# Patient Record
Sex: Female | Born: 1965 | Race: White | Hispanic: No | Marital: Married
Health system: Southern US, Community
[De-identification: ages and names within clinical notes are randomized; demographics above are authoritative.]

## PROBLEM LIST (undated history)

## (undated) DIAGNOSIS — D219 Benign neoplasm of connective and other soft tissue, unspecified: Secondary | ICD-10-CM

## (undated) DIAGNOSIS — E0590 Thyrotoxicosis, unspecified without thyrotoxic crisis or storm: Secondary | ICD-10-CM

## (undated) DIAGNOSIS — D649 Anemia, unspecified: Secondary | ICD-10-CM

---

## 2017-04-27 ENCOUNTER — Other Ambulatory Visit: Payer: Self-pay | Admitting: Obstetrics and Gynecology

## 2017-04-27 DIAGNOSIS — D250 Submucous leiomyoma of uterus: Secondary | ICD-10-CM

## 2017-05-06 ENCOUNTER — Ambulatory Visit
Admission: RE | Admit: 2017-05-06 | Discharge: 2017-05-06 | Disposition: A | Discharge status: Home / Self Care | Payer: BLUE CROSS/BLUE SHIELD | Source: Ambulatory Visit | Attending: Obstetrics and Gynecology | Admitting: Obstetrics and Gynecology

## 2017-05-06 DIAGNOSIS — D250 Submucous leiomyoma of uterus: Secondary | ICD-10-CM

## 2017-05-06 HISTORY — DX: Benign neoplasm of connective and other soft tissue, unspecified: D21.9

## 2017-05-06 HISTORY — DX: Anemia, unspecified: D64.9

## 2017-05-06 HISTORY — PX: IR RADIOLOGIST EVAL & MGMT: IMG5224

## 2017-05-06 HISTORY — DX: Thyrotoxicosis, unspecified without thyrotoxic crisis or storm: E05.90

## 2017-05-06 NOTE — Consult Note (Signed)
Chief Complaint: Patient was seen in consultation today for  Chief Complaint  Patient presents with  . Advice Only    Consult for [place]    at the request of [place]  Referring Physician(s): Mattern,Shannon E  History of Present Illness: Sabrina Clay is a 52 y.o. female with a history of uterine fibroids and menometrorrhagia.  She had relatively regular but heavy cycles for the past 5 years. However, her most recent regular cycle was in July 2018. Beginning in August, she has been having daily bleeding and intermittent cramping. She has undergone both a Pap smear and endometrial biopsy which were negative for evidence of malignancy. A recently obtained MRI of the pelvis demonstrates multiple enhancing uterine fibroids.  Her bleeding is quite heavy with passage of clots and requiring frequent changing of maximum strength pads every 3-4 hours. This is affecting her everyday quality of life. She has been wanting to take up golfing with her husband and son but is unable to due to her fear of spillover while out on course.  Additionally, she is clinically anemic. Her most hemoglobin was less than 9. She is currently taking iron in the form of ferrous sulfate 325 mg twice daily. She has undergone prior treatments for her menorrhagia including endometrial ablation approximately 6 or 7 years ago. She is currently on over-the-counter prophylaxis which is been ineffective at stopping her bleeding. She endorses some mild bulk related symptoms including bloating.  She has experienced occasional hot flashes consistent with premenopausal symptoms.  She denies fever, chills, weight loss, chest pain or shortness of breath. She has discussed the possibility of hysterectomy with Dr. Jobe Igo but is reluctant at this time due to her very busy work and home life.  She is diagnosed with hyperthyroidism approximately 1 year ago and is currently on methimazole. Her thyroid hormone levels are now  normal.  Past Medical History:  Diagnosis Date  . Anemia   . Fibroids   . Hyperthyroidism     Allergies: Patient has no known allergies.  Medications: Prior to Admission medications   Medication Sig Start Date End Date Taking? Authorizing Provider  ferrous sulfate 325 (65 FE) MG tablet Take 325 mg by mouth 2 (two) times daily with a meal.   Yes [provider]  methimazole (TAPAZOLE) 5 MG tablet Take 5 mg by mouth daily. Take 1.5 mg tablets (7.5 mg total) by mouth daily.   Yes [provider]  norethindrone (AYGESTIN) 5 MG tablet Take 5 mg by mouth daily.   Yes [provider]     No family history on file.  Social History   Socioeconomic History  . Marital status: Married    Spouse name: Not on file  . Number of children: Not on file  . Years of education: Not on file  . Highest education level: Not on file  Social Needs  . Financial resource strain: Not on file  . Food insecurity - worry: Not on file  . Food insecurity - inability: Not on file  . Transportation needs - medical: Not on file  . Transportation needs - non-medical: Not on file  Occupational History  . Not on file  Tobacco Use  . Smoking status: Not on file  Substance and Sexual Activity  . Alcohol use: Not on file  . Drug use: Not on file  . Sexual activity: Not on file  Other Topics Concern  . Not on file  Social History Narrative  . Not on  file    Review of Systems: A 12 point ROS discussed and pertinent positives are indicated in the HPI above.  All other systems are negative.  Review of Systems  Vital Signs: BP (!) 159/81   Pulse 80   Temp 98.6 F (37 C) (Oral)   Resp 14   Ht 5\' 8"  (1.727 m)   Wt 160 lb (72.6 kg)   SpO2 100%   BMI 24.33 kg/m   Physical Exam  Constitutional: She is oriented to person, place, and time. She appears well-developed and well-nourished. No distress.  HENT:  Head: Normocephalic and atraumatic.  Neck: No thyromegaly present.   Cardiovascular: Normal rate and regular rhythm.  Pulses:      Radial pulses are 2+ on the left side.       Femoral pulses are 2+ on the right side, and 2+ on the left side. Pulmonary/Chest: Effort normal and breath sounds normal.  Abdominal: Soft. She exhibits no distension.  Uterus palpable above the pubic symphysis  Neurological: She is alert and oriented to person, place, and time.  Skin: Skin is warm and dry.  Psychiatric: She has a normal mood and affect. Her behavior is normal.  Nursing note and vitals reviewed.   Imaging: Ir Radiologist Eval & Mgmt  Result Date: 05/06/2017 Please refer to notes tab for details about interventional procedure. (Op Note)   Labs:  CBC: No results for input(s): WBC, HGB, HCT, PLT in the last 8760 hours.  COAGS: No results for input(s): INR, APTT in the last 8760 hours.  BMP: No results for input(s): NA, K, CL, CO2, GLUCOSE, BUN, CALCIUM, CREATININE, GFRNONAA, GFRAA in the last 8760 hours.  Invalid input(s): CMP  LIVER FUNCTION TESTS: No results for input(s): BILITOT, AST, ALT, ALKPHOS, PROT, ALBUMIN in the last 8760 hours.  TUMOR MARKERS: No results for input(s): AFPTM, CEA, CA199, CHROMGRNA in the last 8760 hours.  Assessment and Plan:  Perimenopausal 52 year old female with prolonged menometrorrhagia for the past 6 months complicated by symptomatic anemia requiring iron supplementation. Prior Pap smear and endometrial biopsy are negative for malignancy. She has an enhancing uterine fibroids on her recent MRI. We discussed the various treatment options including hormonal deprivation therapy, uterine artery embolization and hysterectomy. We are in agreement that either hysterectomy or uterine artery embolization would be her best option. Currently, she is not interested in hysterectomy due to her very busy work and home life.  She would be an excellent candidate for uterine artery embolization and after discussing the risks, benefits and  alternatives, she has decided to proceed.  1.) Please schedule for hypogastric nerve block and uterine artery embolization to be performed at [facility].  Thank you for this interesting consult.  I greatly enjoyed meeting Sabrina Clay and look forward to participating in their care.  A copy of this report was sent to the requesting provider on this date.  Electronically Signed: Jacqulynn Cadet 05/06/2017, 12:25 PM   I spent a total of  60 Minutes  in face to face in clinical consultation, greater than 50% of which was counseling/coordinating care for symptomatic uterine fibroids.

## 2017-05-21 ENCOUNTER — Other Ambulatory Visit (HOSPITAL_COMMUNITY): Payer: Self-pay | Admitting: Interventional Radiology

## 2017-05-21 DIAGNOSIS — D250 Submucous leiomyoma of uterus: Secondary | ICD-10-CM

## 2017-06-18 ENCOUNTER — Ambulatory Visit
Admission: RE | Admit: 2017-06-18 | Discharge: 2017-06-18 | Disposition: A | Discharge status: Home / Self Care | Payer: BLUE CROSS/BLUE SHIELD | Source: Ambulatory Visit | Attending: Interventional Radiology | Admitting: Interventional Radiology

## 2017-06-18 ENCOUNTER — Encounter: Payer: Self-pay | Admitting: *Deleted

## 2017-06-18 DIAGNOSIS — D250 Submucous leiomyoma of uterus: Secondary | ICD-10-CM

## 2017-06-18 HISTORY — PX: IR RADIOLOGIST EVAL & MGMT: IMG5224

## 2017-06-18 NOTE — Progress Notes (Signed)
Chief Complaint: Patient was seen in follow up today for  Chief Complaint  Patient presents with  . Follow-up    1 mo follow up [place]   at the request of [place]  Referring Physician(s): Dr. Jobe Igo  History of Present Illness: Sabrina Clay is a 52 y.o. female with a history of symptomatic uterine fibroids and menometrorrhagia resulting in symptomatic anemia.  She underwent bilateral uterine artery embolization with hypogastric nerve block on 05/18/2017 and presents today for her initial follow-up evaluation.  Her postprocedural course was uncomplicated.  She was discharged on postoperative day 1 and did well at home.  She r returned to week at 1 week post procedure.  She has had some mild fatigue, but this is resolving.  Her constant daily bleeding has completely resolved.  She had a normal and relatively light cycle last week.  She has some mild residual pelvic cramping.  She denies fever, chills, chest pain or shortness of breath.  No significant vaginal discharge or foul odor.  Past Medical History:  Diagnosis Date  . Anemia   . Fibroids   . Hyperthyroidism     Allergies: Patient has no known allergies.  Medications: Prior to Admission medications   Medication Sig Start Date End Date Taking? Authorizing Provider  ferrous sulfate 325 (65 FE) MG tablet Take 325 mg by mouth 2 (two) times daily with a meal.   Yes [provider]  methimazole (TAPAZOLE) 5 MG tablet Take 5 mg by mouth daily. Take 1.5 mg tablets (7.5 mg total) by mouth daily.   Yes [provider]  norethindrone (AYGESTIN) 5 MG tablet Take 5 mg by mouth daily.    [provider]     No family history on file.  Social History   Socioeconomic History  . Marital status: Married    Spouse name: Not on file  . Number of children: Not on file  . Years of education: Not on file  . Highest education level: Not on file  Occupational History  . Not on file  Social Needs  .  Financial resource strain: Not on file  . Food insecurity:    Worry: Not on file    Inability: Not on file  . Transportation needs:    Medical: Not on file    Non-medical: Not on file  Tobacco Use  . Smoking status: Not on file  Substance and Sexual Activity  . Alcohol use: Not on file  . Drug use: Not on file  . Sexual activity: Not on file  Lifestyle  . Physical activity:    Days per week: Not on file    Minutes per session: Not on file  . Stress: Not on file  Relationships  . Social connections:    Talks on phone: Not on file    Gets together: Not on file    Attends religious service: Not on file    Active member of club or organization: Not on file    Attends meetings of clubs or organizations: Not on file    Relationship status: Not on file  Other Topics Concern  . Not on file  Social History Narrative  . Not on file   Review of Systems: A 12 point ROS discussed and pertinent positives are indicated in the HPI above.  All other systems are negative.  Review of Systems  Vital Signs: BP (!) 151/79   Pulse 72   Temp 97.9 F (36.6 C) (Oral)   Resp 14  LMP 06/12/2017   SpO2 100%   Physical Exam  Constitutional: She is oriented to person, place, and time. She appears well-developed and well-nourished. No distress.  HENT:  Head: Normocephalic and atraumatic.  Eyes: No scleral icterus.  Cardiovascular: Normal rate and regular rhythm.  Pulmonary/Chest: Effort normal.  Abdominal: Soft. She exhibits no distension. There is no tenderness.  Neurological: She is alert and oriented to person, place, and time.  Skin: Skin is warm and dry.  Well healed access site, left volar wrist.   Psychiatric: She has a normal mood and affect. Her behavior is normal.  Nursing note and vitals reviewed.    Imaging: Ir Radiologist Eval & Mgmt  Result Date: 06/18/2017 Please refer to notes tab for details about interventional procedure. (Op Note)   Labs:  CBC: No results for  input(s): WBC, HGB, HCT, PLT in the last 8760 hours.  COAGS: No results for input(s): INR, APTT in the last 8760 hours.  BMP: No results for input(s): NA, K, CL, CO2, GLUCOSE, BUN, CALCIUM, CREATININE, GFRNONAA, GFRAA in the last 8760 hours.  Invalid input(s): CMP  LIVER FUNCTION TESTS: No results for input(s): BILITOT, AST, ALT, ALKPHOS, PROT, ALBUMIN in the last 8760 hours.  TUMOR MARKERS: No results for input(s): AFPTM, CEA, CA199, CHROMGRNA in the last 8760 hours.  Assessment and Plan:  52 year old female doing exceptionally well status post bilateral uterine artery embolization from a radial artery approach.  Her postoperative course was uncomplicated.  She did well and returned to work 1 week post procedure.  She has now currently asymptomatic and without complaint.  She had a normal and relatively light menstrual cycle last week.  Her continuous daily bleeding which she was struggling with has completely resolved.  Additionally, she reports that her most recent hemoglobin has improved to 10 and she is feeling more energetic and less fatigued.  1.)  Clinic staff to call in 3 months for telephone evaluation. 2.)  Return clinic visit in 6 months for final postprocedural evaluation.  Electronically Signed: Jacqulynn Cadet 06/18/2017, 11:10 AM   I spent a total of 15 Minutes in face to face in clinical consultation, greater than 50% of which was counseling/coordinating care for symptomatic uterine fibroids post [place].

## 2017-10-22 ENCOUNTER — Telehealth: Payer: Self-pay | Admitting: Radiology

## 2017-10-22 ENCOUNTER — Other Ambulatory Visit (HOSPITAL_COMMUNITY): Payer: Self-pay | Admitting: Interventional Radiology

## 2017-10-22 DIAGNOSIS — D219 Benign neoplasm of connective and other soft tissue, unspecified: Secondary | ICD-10-CM

## 2017-10-22 NOTE — Telephone Encounter (Signed)
Phoned patient to schedule 6 mo follow up [place].  Patient reports that she is doing great & has only had 1 menstrual cycle since the [place].  Because she is doing so well, she is deferring IR follow up but will continue routine gynecologic follow ups with Dr. Brigid Re.  Fusaye Wachtel Riki Rusk, RN 10/22/2017 10:35 AM

## 2017-12-02 ENCOUNTER — Other Ambulatory Visit: Payer: Self-pay | Admitting: Radiology

## 2017-12-02 ENCOUNTER — Other Ambulatory Visit (HOSPITAL_COMMUNITY): Payer: Self-pay | Admitting: Interventional Radiology

## 2017-12-02 DIAGNOSIS — D219 Benign neoplasm of connective and other soft tissue, unspecified: Secondary | ICD-10-CM

## 2017-12-02 DIAGNOSIS — D259 Leiomyoma of uterus, unspecified: Secondary | ICD-10-CM

## 2020-02-24 ENCOUNTER — Other Ambulatory Visit: Payer: Self-pay | Admitting: Family

## 2020-02-24 DIAGNOSIS — R9389 Abnormal findings on diagnostic imaging of other specified body structures: Secondary | ICD-10-CM

## 2020-02-29 ENCOUNTER — Other Ambulatory Visit: Payer: Self-pay

## 2020-02-29 ENCOUNTER — Ambulatory Visit
Admission: RE | Admit: 2020-02-29 | Discharge: 2020-02-29 | Disposition: A | Discharge status: Home / Self Care | Payer: BLUE CROSS/BLUE SHIELD | Source: Ambulatory Visit | Attending: Family | Admitting: Family

## 2020-02-29 ENCOUNTER — Other Ambulatory Visit: Payer: Self-pay | Admitting: Pediatrics

## 2020-02-29 ENCOUNTER — Ambulatory Visit
Admission: RE | Admit: 2020-02-29 | Discharge: 2020-02-29 | Disposition: A | Discharge status: Home / Self Care | Payer: BC Managed Care – PPO | Source: Ambulatory Visit | Attending: Family | Admitting: Family

## 2020-02-29 DIAGNOSIS — R9389 Abnormal findings on diagnostic imaging of other specified body structures: Secondary | ICD-10-CM

## 2020-02-29 MED ORDER — GADOBUTROL 1 MMOL/ML IV SOLN
5.0000 mL | Freq: Once | INTRAVENOUS | Status: AC | PRN
  Administered 2020-02-29: 5 mL via INTRAVENOUS

## 2022-03-29 IMAGING — MR MR BREAST BX W LOC DEV 1ST LESION IMAGE BX SPEC MR GUIDE*L*
6 of 8 series · 32 of 48 positions shown · IV contrast (6 ml gadavist)
Comparison: Previous exams.
COMPARISON: Previous exams.

Addendum:
CLINICAL DATA: Mass in the inferior left breast

EXAM:
MRI GUIDED CORE NEEDLE BIOPSY OF THE LEFT BREAST
TECHNIQUE: Multiplanar, multisequence MR imaging of the left breast was
performed both before and after administration of intravenous
contrast.
CONTRAST:  7 mL of Gadavist

[Series 2: fiducial unilateral · sagittal · 2.0mm · 1.33mm/px · 1 of 52 slices shown]
[im 1/52]
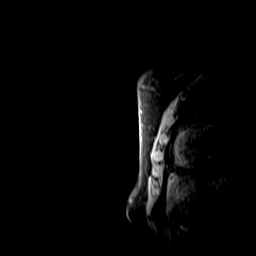

[Series 3: dynamic pre · axial · non-contrast · 1.3mm · 0.73mm/px · z∈[-80,+106]mm · 6 of 144 slices shown]
[im 1/144]
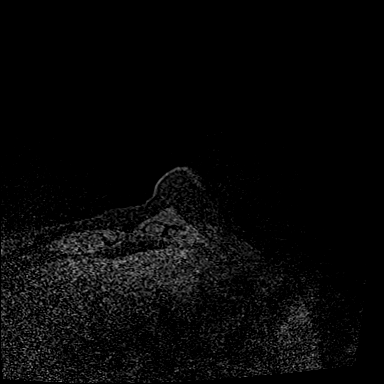
[im 29/144]
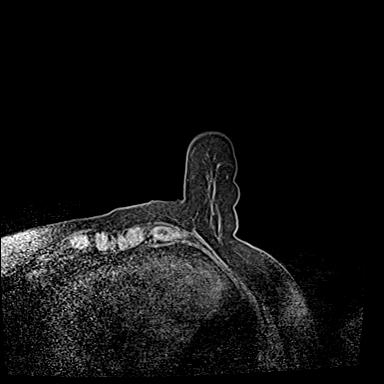
[im 58/144]
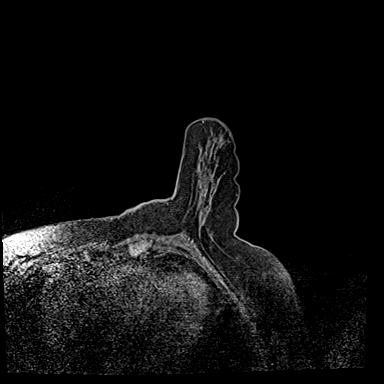
[im 86/144]
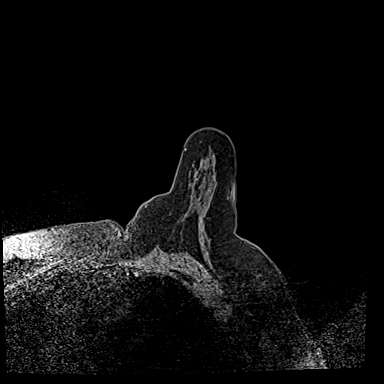
[im 115/144]
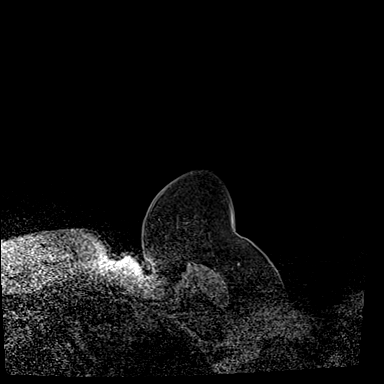
[im 144/144]
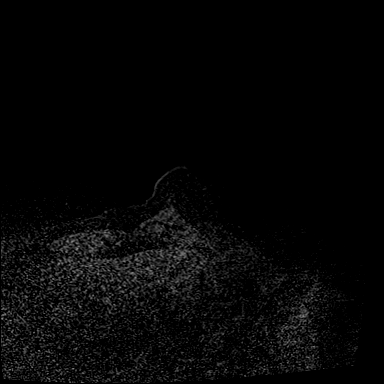

[Series 4: dynamic post 20 · axial · 1.3mm · 0.73mm/px · z∈[-80,+106]mm · 6 of 144 slices shown (1 of 2)]
[im 1/144]
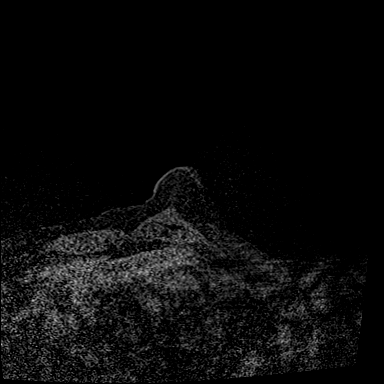
[im 29/144]
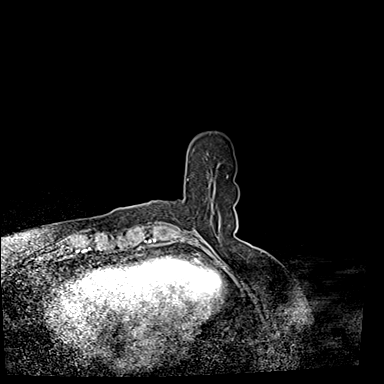
[im 58/144]
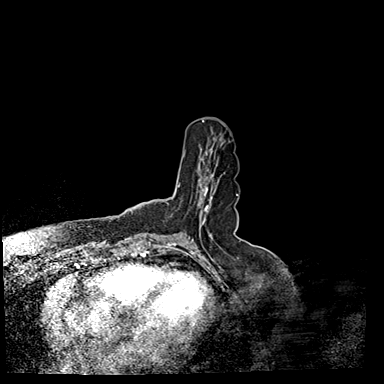
[im 86/144]
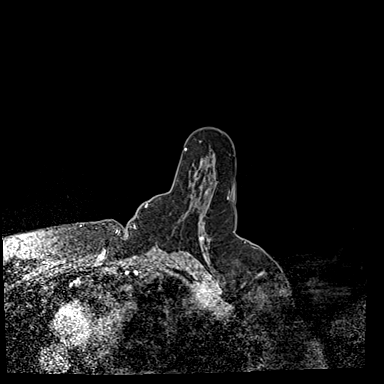
[im 115/144]
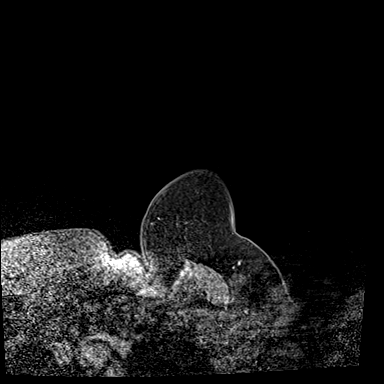
[im 144/144]
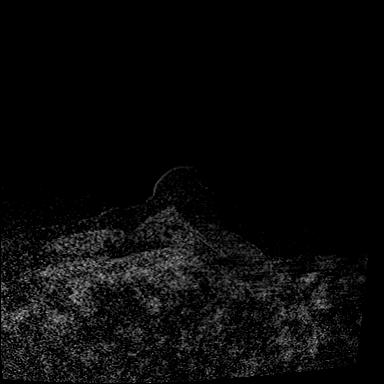

[Series 5: dynamic post 20 · axial · 1.3mm · 0.73mm/px · z∈[-80,+106]mm · 7 of 144 slices shown (2 of 2)]
[im 1/144]
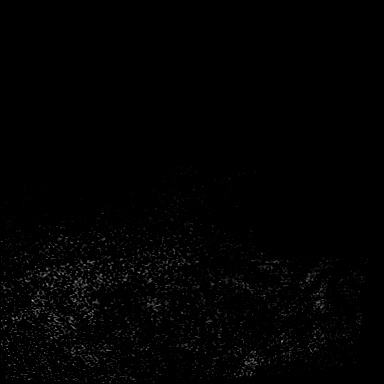
[im 24/144]
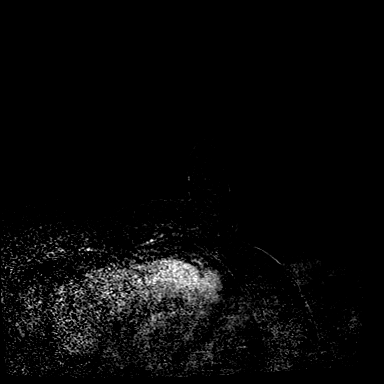
[im 48/144]
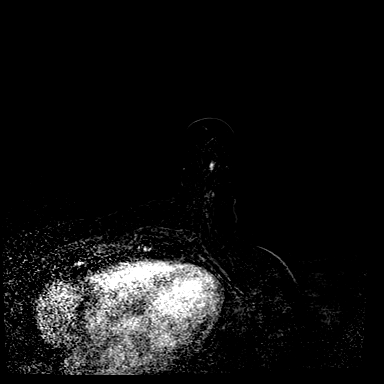
[im 72/144]
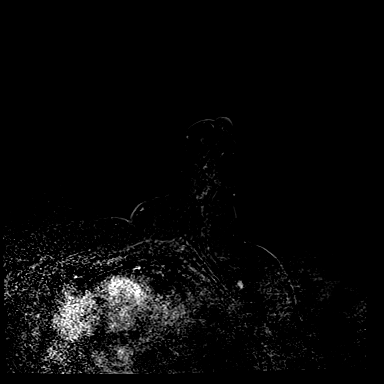
[im 96/144]
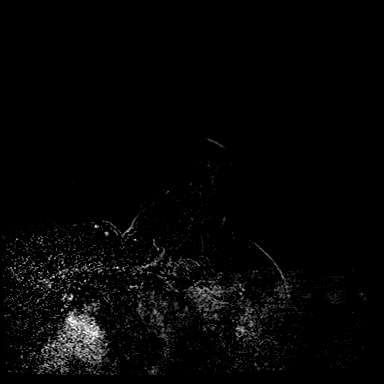
[im 120/144]
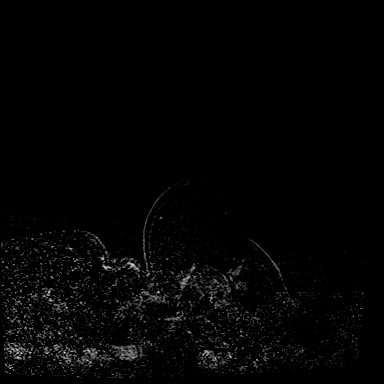
[im 144/144]
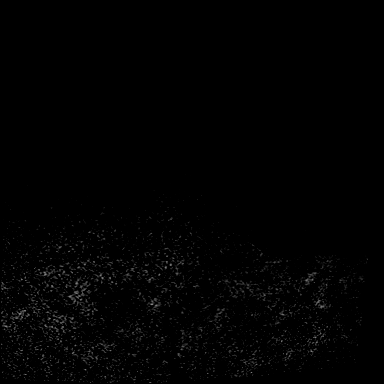

[Series 6: dynamic post 3 · axial · 1.3mm · 0.73mm/px · z∈[-80,+106]mm · 7 of 144 slices shown (1 of 2)]
[im 1/144]
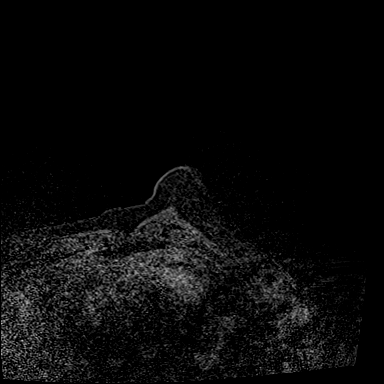
[im 24/144]
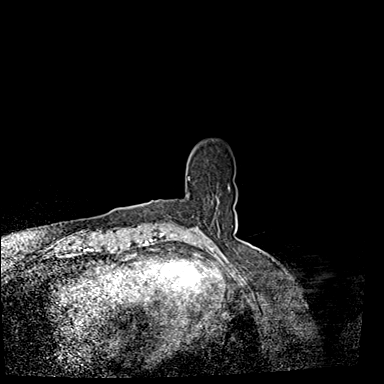
[im 48/144]
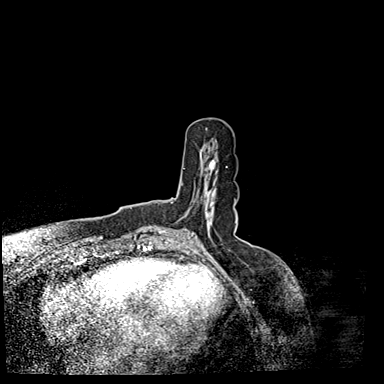
[im 72/144]
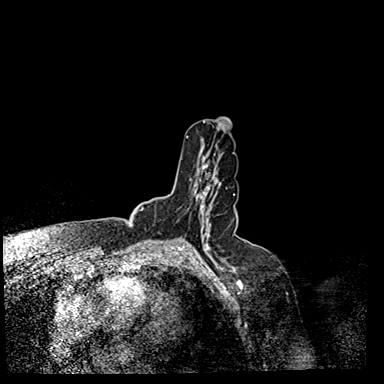
[im 96/144]
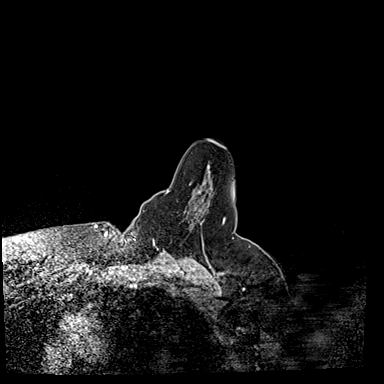
[im 120/144]
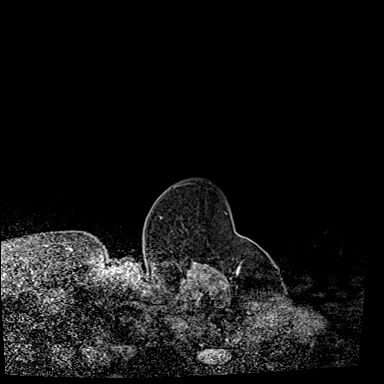
[im 144/144]
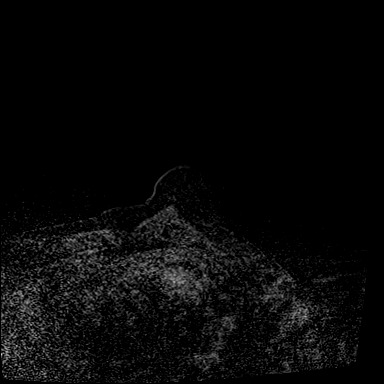

[Series 7: dynamic post 3 · axial · 1.3mm · 0.73mm/px · z∈[-80,+44]mm · 5 of 144 slices shown (2 of 2)]
[im 1/144]
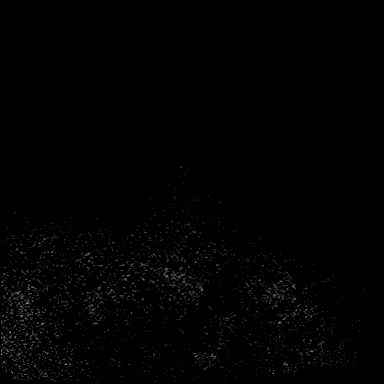
[im 24/144]
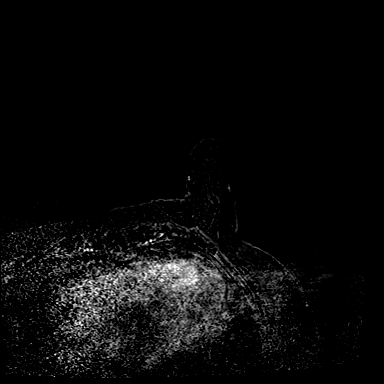
[im 48/144]
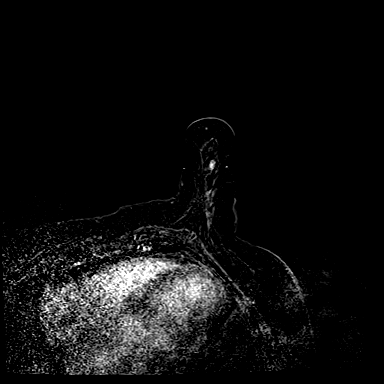
[im 72/144]
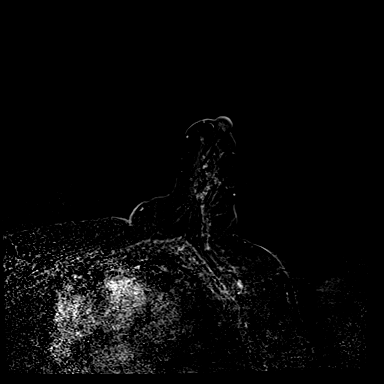
[im 96/144]
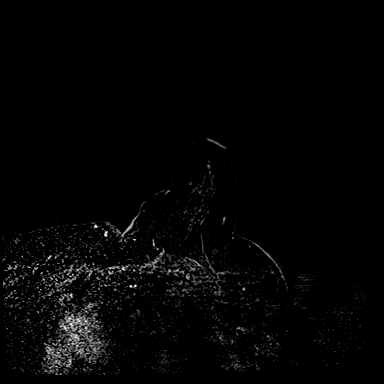

[32 of 48 positions shown; findings below may reference images not displayed]

FINDINGS: I met with the patient, and we discussed the procedure of MRI guided
biopsy, including risks, benefits, and alternatives. Specifically,
we discussed the risks of infection, bleeding, tissue injury, clip
migration, and inadequate sampling. Informed, written consent was
given. The usual time out protocol was performed immediately prior
to the procedure.

Using sterile technique, 1% Lidocaine, MRI guidance, and a 9 gauge
vacuum assisted device, biopsy was performed of a 6 mm mass in the
inferior left breast using a lateral approach. At the conclusion of
the procedure, a tissue marker clip was deployed into the biopsy
cavity. Follow-up 2-view mammogram was performed and dictated
separately.
IMPRESSION: MRI guided biopsy of a 6 mm mass in the inferior left breast. No
apparent complications.

ADDENDUM:
Pathology revealed COMPLEX SCLEROSING LESION WITH USUAL DUCTAL
HYPERPLASIA AND CALCIFICATIONS. FIBROCYSTIC CHANGES WITH USUAL
DUCTAL HYPERPLASIA AND CALCIFICATIONS of the LEFT breast, inferior.
This was found to be concordant by Dr. Jeung Terrence, with excision
recommended.

Pathology results were discussed with the patient by telephone with
Filipmichaela Neduchal [REDACTED]. The patient reported doing
well after the biopsy with tenderness at the site. Post biopsy
instructions and care were reviewed and questions were answered. The
patient was encouraged to call [REDACTED] for any additional concerns.

Surgical consultation has been arranged with Dr. Burak-Ceyda Gullak at [[email or web address]] on March 19, 2020.

Pathology results reported by Hugo Froilan Niicole RN on 03/01/2020.

*** End of Addendum ***
FINDINGS: I met with the patient, and we discussed the procedure of MRI guided
biopsy, including risks, benefits, and alternatives. Specifically,
we discussed the risks of infection, bleeding, tissue injury, clip
migration, and inadequate sampling. Informed, written consent was
given. The usual time out protocol was performed immediately prior
to the procedure.

Using sterile technique, 1% Lidocaine, MRI guidance, and a 9 gauge
vacuum assisted device, biopsy was performed of a 6 mm mass in the
inferior left breast using a lateral approach. At the conclusion of
the procedure, a tissue marker clip was deployed into the biopsy
cavity. Follow-up 2-view mammogram was performed and dictated
separately.
IMPRESSION: MRI guided biopsy of a 6 mm mass in the inferior left breast. No
apparent complications.
